# Patient Record
Sex: Male | Born: 1960 | Race: Black or African American | Hispanic: No | Marital: Married | Smoking: Never smoker
Health system: Southern US, Community
[De-identification: ages and names within clinical notes are randomized; demographics above are authoritative.]

---

## 2009-01-11 ENCOUNTER — Emergency Department: Payer: Self-pay | Admitting: Emergency Medicine

## 2017-04-10 ENCOUNTER — Encounter: Payer: Self-pay | Admitting: Emergency Medicine

## 2017-04-10 ENCOUNTER — Emergency Department: Payer: Commercial Managed Care - PPO

## 2017-04-10 ENCOUNTER — Emergency Department
Admission: EM | Admit: 2017-04-10 | Discharge: 2017-04-10 | Disposition: A | Discharge status: Home / Self Care | Payer: Commercial Managed Care - PPO | Attending: Emergency Medicine | Admitting: Emergency Medicine

## 2017-04-10 DIAGNOSIS — Y92094 Garage of other non-institutional residence as the place of occurrence of the external cause: Secondary | ICD-10-CM | POA: Insufficient documentation

## 2017-04-10 DIAGNOSIS — Y9389 Activity, other specified: Secondary | ICD-10-CM | POA: Insufficient documentation

## 2017-04-10 DIAGNOSIS — Y999 Unspecified external cause status: Secondary | ICD-10-CM | POA: Diagnosis not present

## 2017-04-10 DIAGNOSIS — S2242XA Multiple fractures of ribs, left side, initial encounter for closed fracture: Secondary | ICD-10-CM

## 2017-04-10 DIAGNOSIS — S299XXA Unspecified injury of thorax, initial encounter: Secondary | ICD-10-CM | POA: Diagnosis present

## 2017-04-10 MED ORDER — OXYCODONE-ACETAMINOPHEN 5-325 MG PO TABS: 1.0000 | ORAL_TABLET | Freq: Four times a day (QID) | ORAL | 0 refills | Status: AC | PRN
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
## 2020-01-03 ENCOUNTER — Ambulatory Visit: Payer: Commercial Managed Care - PPO | Attending: Internal Medicine

## 2020-01-03 DIAGNOSIS — Z23 Encounter for immunization: Secondary | ICD-10-CM

## 2020-01-03 NOTE — Progress Notes (Signed)
   Covid-19 Vaccination Clinic  Name:  RON BESKE    MRN: [id number] DOB: [birth year]  01/03/2020  Mr. Shearer was observed post Covid-19 immunization for 15 minutes without incident. He was provided with Vaccine Information Sheet and instruction to access the V-Safe system.   Mr. Wichert was instructed to call 911 with any severe reactions post vaccine: Marland Kitchen Difficulty breathing  . Swelling of face and throat  . A fast heartbeat  . A bad rash all over body  . Dizziness and weakness   Immunizations Administered    Name Date Dose VIS Date Route   Pfizer COVID-19 Vaccine 01/03/2020  5:05 PM 0.3 mL 09/14/2019 Intramuscular   Manufacturer: [employer], [employer]   Lot: [id number]   NDC: [address]-1

## 2020-01-25 ENCOUNTER — Ambulatory Visit: Payer: Commercial Managed Care - PPO | Attending: Internal Medicine

## 2020-01-25 DIAGNOSIS — Z23 Encounter for immunization: Secondary | ICD-10-CM

## 2020-01-25 NOTE — Progress Notes (Signed)
   Covid-19 Vaccination Clinic  Name:  Corey Brandt    MRN: [id number] DOB: 1961-01-02  01/25/2020  [name] was observed post Covid-19 immunization for 15 minutes without incident. He was provided with Vaccine Information Sheet and instruction to access the V-Safe system.   [name] was instructed to call 911 with any severe reactions post vaccine: Marland Kitchen Difficulty breathing  . Swelling of face and throat  . A fast heartbeat  . A bad rash all over body  . Dizziness and weakness   Immunizations Administered    Name Date Dose VIS Date Route   Pfizer COVID-19 Vaccine 01/25/2020  4:40 PM 0.3 mL 11/28/2018 Intramuscular   Manufacturer: [employer], [employer]   Lot: [id number]   NDC: [address]-1

## 2020-02-09 ENCOUNTER — Ambulatory Visit: Payer: Commercial Managed Care - PPO

## 2022-03-04 ENCOUNTER — Other Ambulatory Visit: Payer: Self-pay | Admitting: Family

## 2022-03-04 DIAGNOSIS — R1032 Left lower quadrant pain: Secondary | ICD-10-CM

## 2022-03-09 ENCOUNTER — Ambulatory Visit: Payer: Commercial Managed Care - PPO

## 2022-05-22 ENCOUNTER — Encounter: Payer: Self-pay | Admitting: Emergency Medicine

## 2022-05-22 ENCOUNTER — Other Ambulatory Visit: Payer: Self-pay

## 2022-05-22 ENCOUNTER — Emergency Department
Admission: EM | Admit: 2022-05-22 | Discharge: 2022-05-22 | Disposition: A | Discharge status: Home / Self Care | Payer: Commercial Managed Care - PPO | Attending: Emergency Medicine | Admitting: Emergency Medicine

## 2022-05-22 DIAGNOSIS — B9689 Other specified bacterial agents as the cause of diseases classified elsewhere: Secondary | ICD-10-CM | POA: Diagnosis not present

## 2022-05-22 DIAGNOSIS — H1089 Other conjunctivitis: Secondary | ICD-10-CM | POA: Insufficient documentation

## 2022-05-22 DIAGNOSIS — H5789 Other specified disorders of eye and adnexa: Secondary | ICD-10-CM | POA: Diagnosis present

## 2022-05-22 MED ORDER — TOBRAMYCIN 0.3 % OP SOLN: 2.0000 [drp] | OPHTHALMIC | 0 refills | Status: AC

## 2022-05-22 NOTE — ED Provider Notes (Signed)
   [facility] Provider Note    Event Date/Time   First MD Initiated Contact with Patient 05/22/22 0720     (approximate)   History   Conjunctivitis   HPI  Corey Brandt is a 61 y.o. male   presents to the ED with complaint of eye redness and itching that started yesterday.  He states this morning he woke up with his eyelashes matted shut.  He was around his nieces and nephews this weekend who have pinkeye currently.  Patient denies any health problems.      Physical Exam   Triage Vital Signs: ED Triage Vitals  Enc Vitals Group     BP --      Pulse --      Resp --      Temp --      Temp src --      SpO2 --      Weight 05/22/22 0716 198 lb (89.8 kg)     Height 05/22/22 0716 5\' 11"  (1.803 m)     Head Circumference --      Peak Flow --      Pain Score 05/22/22 0715 7     Pain Loc --      Pain Edu? --      Excl. in GC? --     Most recent vital signs: Vitals:   05/22/22 0722  BP: (!) 153/87  Pulse: 63  Resp: 20  Temp: 98.3 F (36.8 C)  SpO2: 100%     General: Awake, no distress.  CV:  Good peripheral perfusion.  Resp:  Normal effort.  Abd:  No distention.  Other:  Bilateral conjunctivitis with drainage bilaterally.  PERRLA, EOMI's.   ED Results / Procedures / Treatments   Labs (all labs ordered are listed, but only abnormal results are displayed) Labs Reviewed - No data to display    PROCEDURES:  Critical Care performed:   Procedures   MEDICATIONS ORDERED IN ED: Medications - No data to display   IMPRESSION / MDM / ASSESSMENT AND PLAN / ED COURSE  I reviewed the triage vital signs and the nursing notes.   Differential diagnosis includes, but is not limited to, bacterial conjunctivitis, viral conjunctivitis, allergic conjunctivitis.  61 year old male presents to the ED with complaint of pinkeye to both eyes after being exposed to his nieces and nephews who currently have it this weekend.  He denies any visual  changes but complains of lashes being matted shut this morning.  Physical exam is consistent with conjunctivitis and patient was placed on antibiotic eyedrops to use until clear.  Patient was also given a work note.      Patient's presentation is most consistent with acute, uncomplicated illness.  FINAL CLINICAL IMPRESSION(S) / ED DIAGNOSES   Final diagnoses:  Bacterial conjunctivitis of both eyes     Rx / DC Orders   ED Discharge Orders          Ordered    tobramycin (TOBREX) 0.3 % ophthalmic solution  Every 4 hours        05/22/22 0734             Note:  This document was prepared using Dragon voice recognition software and may include unintentional dictation errors.   05/24/22, PA-C 05/22/22 05/24/22    [id number], MD 05/22/22 [phone]

## 2022-05-22 NOTE — Discharge Instructions (Addendum)
Begin using eyedrops until completely cleared.  Eyedrops are every 4 hours to both eyes.  Wash your hands immediately after using the eyedrops to prevent reinfection.  If any continued problems follow-up with [facility], Dr. Brooke Dare is the ophthalmologist on-call and contact information and address are listed on your discharge papers.

## 2022-05-22 NOTE — ED Triage Notes (Signed)
Pt reports was around his nieces and nephews this weekend and thinks he picked up pink eye. Pt reports started with eye redness and itching yesterday and this am woke up with his eyes sealed shut and burning.
